# Patient Record
Sex: Male | Born: 2000 | Race: White | Hispanic: No | Marital: Single | Smoking: Never smoker
Health system: Southern US, Community
[De-identification: ages and names within clinical notes are randomized; demographics above are authoritative.]

## PROBLEM LIST (undated history)

## (undated) DIAGNOSIS — F909 Attention-deficit hyperactivity disorder, unspecified type: Secondary | ICD-10-CM

---
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
## 2020-05-12 DIAGNOSIS — Z03818 Encounter for observation for suspected exposure to other biological agents ruled out: Secondary | ICD-10-CM | POA: Diagnosis not present

## 2020-07-23 DIAGNOSIS — Z20822 Contact with and (suspected) exposure to covid-19: Secondary | ICD-10-CM | POA: Diagnosis not present

## 2020-10-15 DIAGNOSIS — H5213 Myopia, bilateral: Secondary | ICD-10-CM | POA: Diagnosis not present

## 2021-08-02 ENCOUNTER — Other Ambulatory Visit: Payer: Self-pay

## 2021-08-02 ENCOUNTER — Emergency Department: Payer: Medicaid Other

## 2021-08-02 ENCOUNTER — Encounter: Payer: Self-pay | Admitting: Intensive Care

## 2021-08-02 ENCOUNTER — Emergency Department
Admission: EM | Admit: 2021-08-02 | Discharge: 2021-08-02 | Disposition: A | Discharge status: Home / Self Care | Payer: Medicaid Other | Attending: Emergency Medicine | Admitting: Emergency Medicine

## 2021-08-02 DIAGNOSIS — R000 Tachycardia, unspecified: Secondary | ICD-10-CM | POA: Diagnosis not present

## 2021-08-02 DIAGNOSIS — R112 Nausea with vomiting, unspecified: Secondary | ICD-10-CM | POA: Insufficient documentation

## 2021-08-02 DIAGNOSIS — J40 Bronchitis, not specified as acute or chronic: Secondary | ICD-10-CM | POA: Insufficient documentation

## 2021-08-02 DIAGNOSIS — Z20822 Contact with and (suspected) exposure to covid-19: Secondary | ICD-10-CM | POA: Diagnosis not present

## 2021-08-02 DIAGNOSIS — R0602 Shortness of breath: Secondary | ICD-10-CM | POA: Diagnosis not present

## 2021-08-02 LAB — COMPREHENSIVE METABOLIC PANEL WITH GFR
ALT: 69 U/L — ABNORMAL HIGH (ref 0–44)
AST: 34 U/L (ref 15–41)
Albumin: 4.5 g/dL (ref 3.5–5.0)
Alkaline Phosphatase: 53 U/L (ref 38–126)
Anion gap: 8 (ref 5–15)
BUN: 13 mg/dL (ref 6–20)
CO2: 25 mmol/L (ref 22–32)
Calcium: 9.4 mg/dL (ref 8.9–10.3)
Chloride: 106 mmol/L (ref 98–111)
Creatinine, Ser: 0.61 mg/dL (ref 0.61–1.24)
GFR, Estimated: >60 mL/min
Glucose, Bld: 93 mg/dL (ref 70–99)
Potassium: 3.8 mmol/L (ref 3.5–5.1)
Sodium: 139 mmol/L (ref 135–145)
Total Bilirubin: 1 mg/dL (ref 0.3–1.2)
Total Protein: 7.3 g/dL (ref 6.5–8.1)

## 2021-08-02 LAB — CBC WITH DIFFERENTIAL/PLATELET
Abs Immature Granulocytes: 0.24 10*3/uL — ABNORMAL HIGH (ref 0.00–0.07)
Basophils Absolute: 0.1 10*3/uL (ref 0.0–0.1)
Basophils Relative: 1 %
Eosinophils Absolute: 0.4 10*3/uL (ref 0.0–0.5)
Eosinophils Relative: 2 %
HCT: 52.2 % — ABNORMAL HIGH (ref 39.0–52.0)
Hemoglobin: 18.1 g/dL — ABNORMAL HIGH (ref 13.0–17.0)
Immature Granulocytes: 2 %
Lymphocytes Relative: 28 %
Lymphs Abs: 4.6 10*3/uL — ABNORMAL HIGH (ref 0.7–4.0)
MCH: 30.5 pg (ref 26.0–34.0)
MCHC: 34.7 g/dL (ref 30.0–36.0)
MCV: 87.9 fL (ref 80.0–100.0)
Monocytes Absolute: 1.3 10*3/uL — ABNORMAL HIGH (ref 0.1–1.0)
Monocytes Relative: 8 %
Neutro Abs: 9.9 10*3/uL — ABNORMAL HIGH (ref 1.7–7.7)
Neutrophils Relative %: 59 %
Platelets: 288 10*3/uL (ref 150–400)
RBC: 5.94 MIL/uL — ABNORMAL HIGH (ref 4.22–5.81)
RDW: 11.9 % (ref 11.5–15.5)
WBC: 16.5 10*3/uL — ABNORMAL HIGH (ref 4.0–10.5)
nRBC: 0 % (ref 0.0–0.2)

## 2021-08-02 LAB — RESP PANEL BY RT-PCR (FLU A&B, COVID) ARPGX2
Influenza A by PCR: NEGATIVE
Influenza B by PCR: NEGATIVE
SARS Coronavirus 2 by RT PCR: NEGATIVE

## 2021-08-02 MED ORDER — ONDANSETRON 4 MG PO TBDP: 4.0000 mg | ORAL_TABLET | Freq: Three times a day (TID) | ORAL | 0 refills | Status: AC | PRN

## 2021-08-02 NOTE — ED Triage Notes (Signed)
Patient c/o "not being able to catch his breath and emesis" X2 days. Reports 5-6 episodes of emesis in the last 24 hours.

## 2021-08-02 NOTE — ED Provider Notes (Signed)
[facility] Emergency Department Provider Note   ____________________________________________   Event Date/Time   First MD Initiated Contact with Patient 08/02/21 1749     (approximate)  I have reviewed the triage vital signs and the nursing notes.   HISTORY  Chief Complaint Emesis    HPI Luke Coffey is a 20 y.o. male with past medical history of ADHD who presents to the ED complaining of vomiting and shortness of breath.  Patient reports that for the past 2 days he has been feeling slightly short of breath and like he "cannot quite get a full breath."  This been associated with a dry cough, but he denies any fevers or pain in his chest.  He does state that he has frequently been feeling nauseous with multiple episodes of vomiting, denies any diarrhea.  He denies any abdominal pain, dysuria, or flank pain.  He is not aware of any sick contacts.  He states he does not currently feel nauseous and is willing to try something to drink.        Past Medical History:  Diagnosis Date   ADHD     There are no problems to display for this patient.   History reviewed. No pertinent surgical history.  Prior to Admission medications   Medication Sig Start Date End Date Taking? Authorizing Provider  ondansetron (ZOFRAN-ODT) 4 MG disintegrating tablet Take 1 tablet (4 mg total) by mouth every 8 (eight) hours as needed for nausea or vomiting. 08/02/21  Yes Chesley Noon, MD  lisdexamfetamine (VYVANSE) 60 MG capsule Take 60 mg by mouth every morning.    [provider]    Allergies Patient has no known allergies.  History reviewed. No pertinent family history.  Social History Social History   Tobacco Use   Smoking status: Never   Smokeless tobacco: Never  Vaping Use   Vaping Use: Every day  Substance Use Topics   Alcohol use: No   Drug use: No    Review of Systems  Constitutional: No fever/chills Eyes: No visual changes. ENT: No  sore throat. Cardiovascular: Denies chest pain. Respiratory: Positive for cough and shortness of breath. Gastrointestinal: No abdominal pain.  Positive for nausea and vomiting.  No diarrhea.  No constipation. Genitourinary: Negative for dysuria. Musculoskeletal: Negative for back pain. Skin: Negative for rash. Neurological: Negative for headaches, focal weakness or numbness.  ____________________________________________   PHYSICAL EXAM:  VITAL SIGNS: ED Triage Vitals [08/02/21 1706]  Enc Vitals Group     BP (!) 140/95     Pulse Rate 96     Resp 20     Temp 98.3 F (36.8 C)     Temp Source Oral     SpO2 93 %     Weight 250 lb (113.4 kg)     Height 5\' 11"  (1.803 m)     Head Circumference      Peak Flow      Pain Score 2     Pain Loc      Pain Edu?      Excl. in GC?     Constitutional: Alert and oriented. Eyes: Conjunctivae are normal. Head: Atraumatic. Nose: No congestion/rhinnorhea. Mouth/Throat: Mucous membranes are moist. Neck: Normal ROM Cardiovascular: Normal rate, regular rhythm. Grossly normal heart sounds.  2+ radial pulses bilaterally. Respiratory: Normal respiratory effort.  No retractions. Lungs CTAB. Gastrointestinal: Soft and nontender. No distention. Genitourinary: deferred Musculoskeletal: No lower extremity tenderness nor edema. Neurologic:  Normal speech and language. No gross  focal neurologic deficits are appreciated. Skin:  Skin is warm, dry and intact. No rash noted. Psychiatric: Mood and affect are normal. Speech and behavior are normal.  ____________________________________________   LABS (all labs ordered are listed, but only abnormal results are displayed)  Labs Reviewed  COMPREHENSIVE METABOLIC PANEL - Abnormal; Notable for the following components:      Result Value   ALT 69 (*)    All other components within normal limits  CBC WITH DIFFERENTIAL/PLATELET - Abnormal; Notable for the following components:   WBC 16.5 (*)    RBC 5.94  (*)    Hemoglobin 18.1 (*)    HCT 52.2 (*)    Neutro Abs 9.9 (*)    Lymphs Abs 4.6 (*)    Monocytes Absolute 1.3 (*)    Abs Immature Granulocytes 0.24 (*)    All other components within normal limits  RESP PANEL BY RT-PCR (FLU A&B, COVID) [id number]   ____________________________________________  EKG  ED ECG REPORT I, Chesley Noon, the attending physician, personally viewed and interpreted this ECG.   Date: 08/02/2021  EKG Time: 18:10  Rate: 103  Rhythm: sinus tachycardia  Axis: Normal  Intervals:none  ST&T Change: None   PROCEDURES  Procedure(s) performed (including Critical Care):  Procedures   ____________________________________________   INITIAL IMPRESSION / ASSESSMENT AND PLAN / ED COURSE      20 year old male with no significant past medical history presents to the ED complaining of nausea, vomiting, cough, and shortness of breath over the past 2 days.  He is not in any respiratory distress on my evaluation and is maintaining O2 sats on room air, was ambulatory to his room without any difficulty breathing.  Chest x-ray reviewed by me and shows no infiltrate, edema, or effusion.  EKG shows no evidence of arrhythmia or ischemia and I doubt cardiac etiology for his symptoms.  I doubt PE given his lack of risk factors and reassuring vital signs.  Labs are unremarkable and LFTs are within normal limits.  Symptoms sound most consistent with infectious etiology such as bronchitis versus COVID-19 and influenza.  Viral testing is pending, patient states nausea is improved and we will p.o. challenge.  Patient tolerating water without difficulty and is appropriate for discharge home, will be prescribed Zofran and was counseled to drink plenty of fluids.  He was counseled to follow-up with his PCP and return to the ED for new or worsening symptoms, patient agrees with plan.      ____________________________________________   FINAL CLINICAL IMPRESSION(S) / ED  DIAGNOSES  Final diagnoses:  Bronchitis  Nausea and vomiting, unspecified vomiting type     ED Discharge Orders          Ordered    ondansetron (ZOFRAN-ODT) 4 MG disintegrating tablet  Every 8 hours PRN        08/02/21 1813             Note:  This document was prepared using Dragon voice recognition software and may include unintentional dictation errors.    Chesley Noon, MD 08/02/21 [phone]

## 2021-08-02 NOTE — ED Provider Notes (Signed)
°  Emergency Medicine Provider Triage Evaluation Note  Luke Coffey , a 20 y.o.male,  was evaluated in triage.  Pt complains of emesis.  Patient states that he has vomited multiple times in the past 24 hours.  Reports some epigastric pain.  Patient states that coughing proceeds most these episodes of vomiting.  He states he believes that he has bronchitis.  Denies fever/chills, back pain, or urinary symptoms.   Review of Systems  Positive: Emesis Negative: Denies fever, chest pain, vomiting  Physical Exam   Vitals:   08/02/21 1706  BP: (!) 140/95  Pulse: 96  Resp: 20  Temp: 98.3 F (36.8 C)  SpO2: 93%   Gen:   Awake, no distress   Resp:  Normal effort  MSK:   Moves extremities without difficulty  Other:    Medical Decision Making  Given the patient's initial medical screening exam, the following diagnostic evaluation has been ordered. The patient will be placed in the appropriate treatment space, once one is available, to complete the evaluation and treatment. I have discussed the plan of care with the patient and I have advised the patient that an ED physician or mid-level practitioner will reevaluate their condition after the test results have been received, as the results may give them additional insight into the type of treatment they may need.    Diagnostics: Labs, CXR, respiratory panel.  Treatments: none immediately   Varney Daily, [place] 08/02/21 1711    Gilles Chiquito, MD 08/02/21 [id number]

## 2021-08-12 DIAGNOSIS — U071 COVID-19: Secondary | ICD-10-CM | POA: Diagnosis not present

## 2021-08-12 DIAGNOSIS — Z20822 Contact with and (suspected) exposure to covid-19: Secondary | ICD-10-CM | POA: Diagnosis not present

## 2022-12-19 ENCOUNTER — Telehealth: Payer: Self-pay

## 2022-12-19 NOTE — Telephone Encounter (Signed)
LVM for patient to call back. AS, CMA 

## 2023-04-21 IMAGING — CR DG CHEST 2V
1 series · 2 of 2 positions shown · non-contrast
Comparison: 09/07/2016

CLINICAL DATA: Shortness of breath

EXAM:
CHEST - 2 VIEW

[Series 1: dg chest 2 view · 0.14mm/px · 2 of 2 slices shown]
[im 1/2]
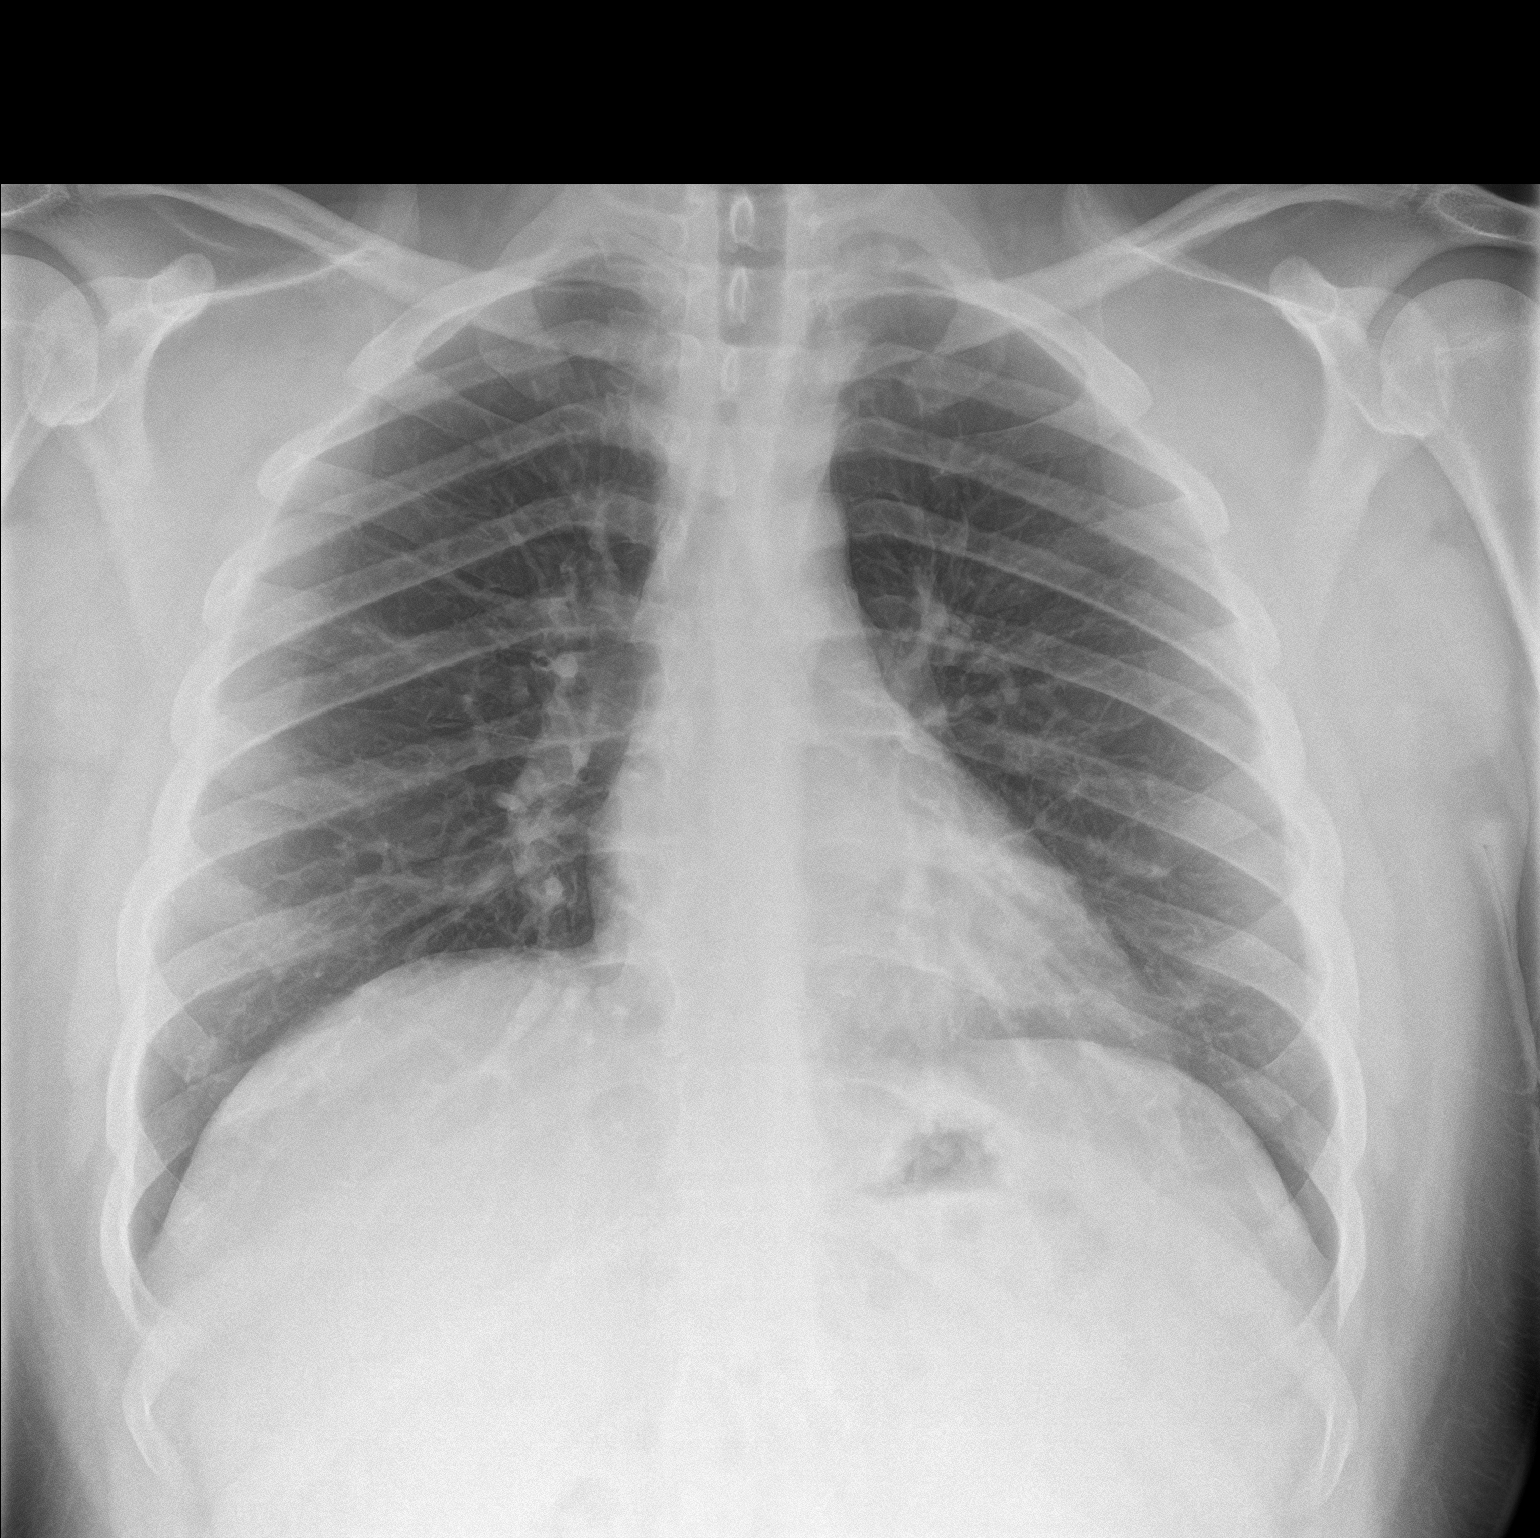
[im 2/2]
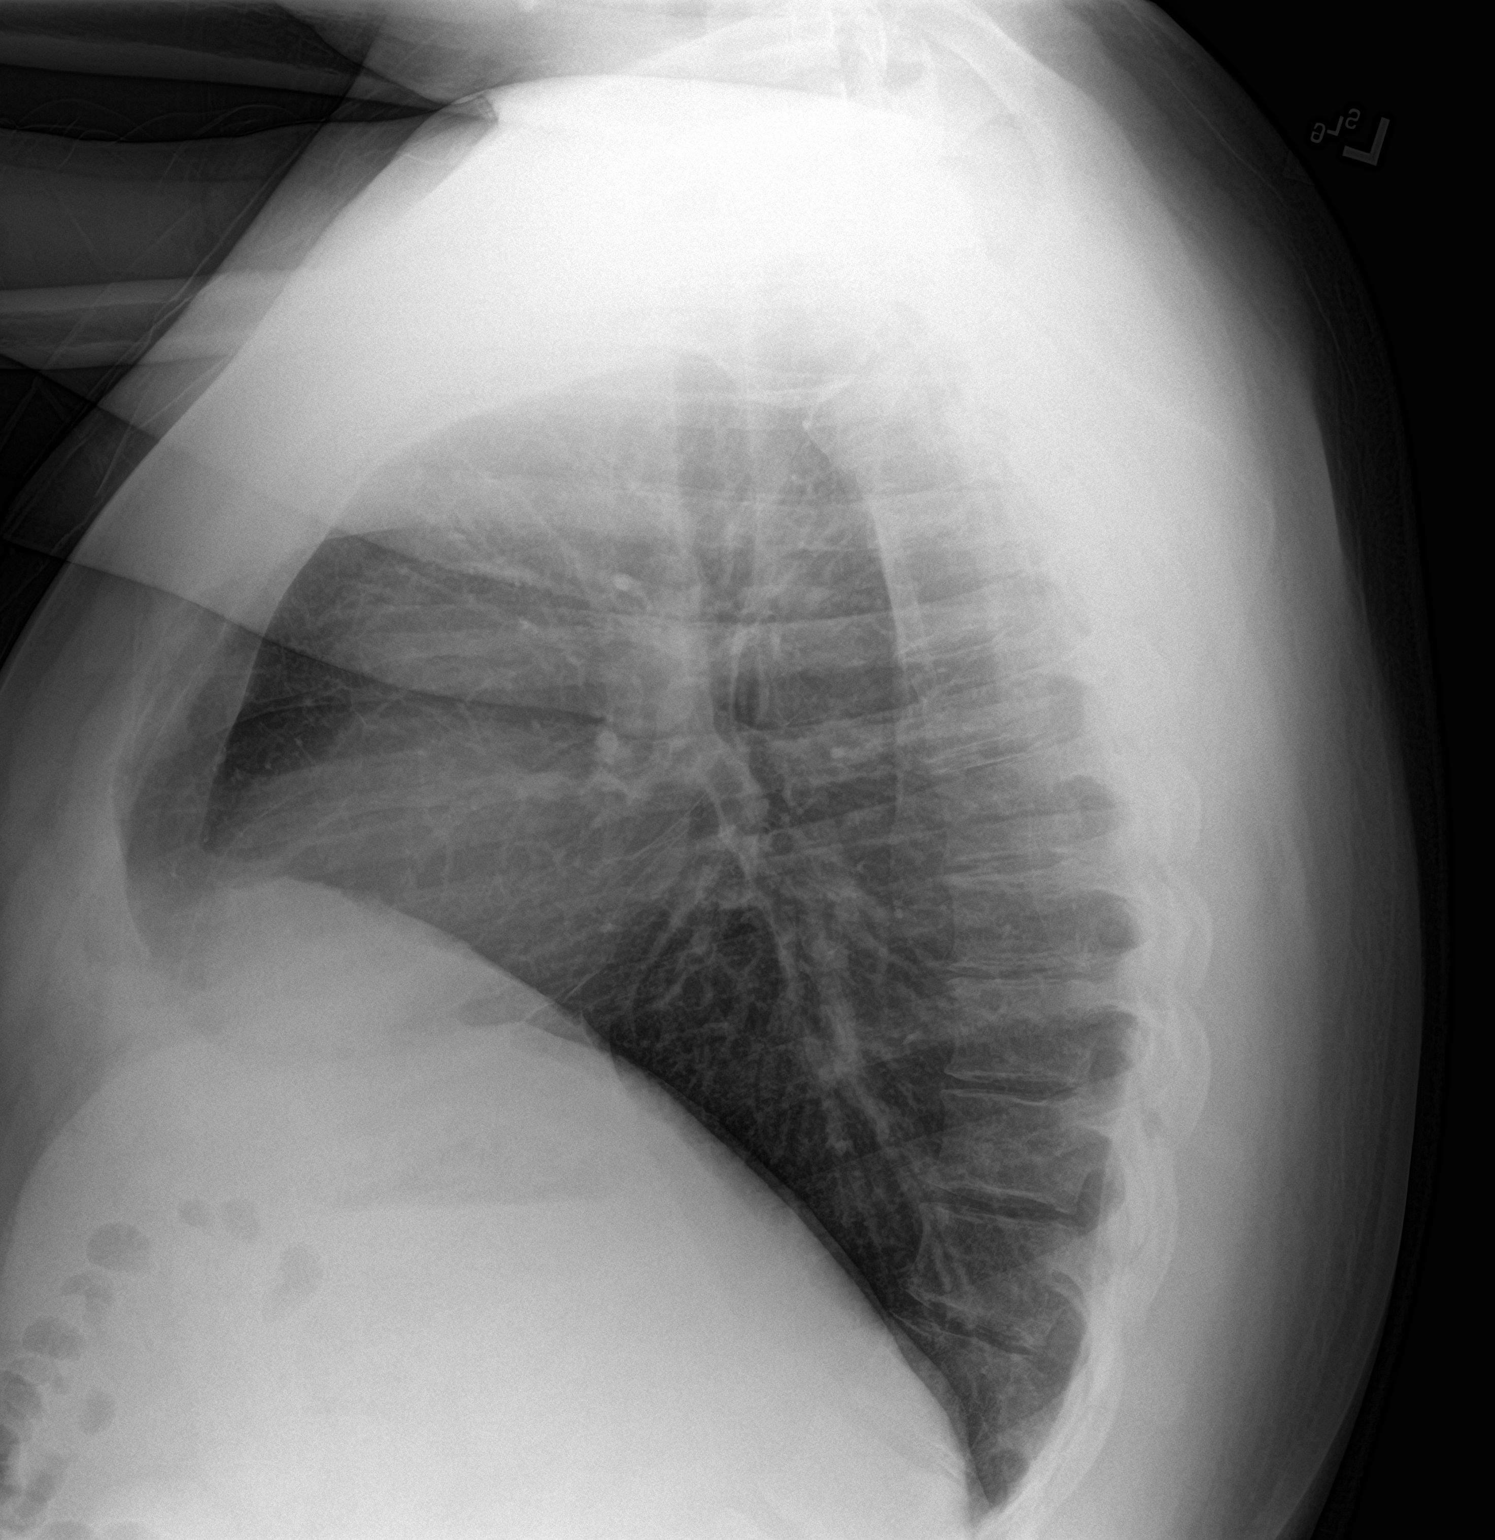

[2 of 2 positions shown; findings below may reference images not displayed]

FINDINGS: The heart size and mediastinal contours are within normal limits.
Both lungs are clear. The visualized skeletal structures are
unremarkable.
IMPRESSION: No active cardiopulmonary disease.
